# Patient Record
Sex: Male | Born: 1985 | Race: White | Hispanic: No | Marital: Single | Smoking: Never smoker
Health system: Southern US, Community
[De-identification: ages and names within clinical notes are randomized; demographics above are authoritative.]

## PROBLEM LIST (undated history)

## (undated) DIAGNOSIS — R55 Syncope and collapse: Secondary | ICD-10-CM

## (undated) DIAGNOSIS — R0789 Other chest pain: Secondary | ICD-10-CM

## (undated) DIAGNOSIS — R001 Bradycardia, unspecified: Secondary | ICD-10-CM

## (undated) DIAGNOSIS — R42 Dizziness and giddiness: Secondary | ICD-10-CM

## (undated) DIAGNOSIS — R519 Headache, unspecified: Secondary | ICD-10-CM

## (undated) HISTORY — PX: WISDOM TOOTH EXTRACTION: SHX21

## (undated) HISTORY — PX: FINGER SURGERY: SHX640

## (undated) HISTORY — DX: Headache, unspecified: R51.9

## (undated) HISTORY — DX: Syncope and collapse: R55

## (undated) HISTORY — DX: Dizziness and giddiness: R42

## (undated) HISTORY — PX: MANDIBLE FRACTURE SURGERY: SHX706

## (undated) HISTORY — PX: SHOULDER ARTHROSCOPY W/ LABRAL REPAIR: SHX2399

## (undated) HISTORY — DX: Bradycardia, unspecified: R00.1

## (undated) HISTORY — PX: KNEE SURGERY: SHX244

## (undated) HISTORY — DX: Other chest pain: R07.89

## (undated) HISTORY — PX: TONSILLECTOMY: SUR1361

---
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
## 2008-11-20 ENCOUNTER — Emergency Department (HOSPITAL_BASED_OUTPATIENT_CLINIC_OR_DEPARTMENT_OTHER): Admission: EM | Admit: 2008-11-20 | Discharge: 2008-11-20 | Payer: Self-pay | Admitting: Emergency Medicine

## 2008-11-20 ENCOUNTER — Ambulatory Visit: Payer: Self-pay | Admitting: Diagnostic Radiology

## 2010-04-08 LAB — CBC
Hemoglobin: 15.6 g/dL (ref 13.0–17.0)
RBC: 5.03 MIL/uL (ref 4.22–5.81)
RDW: 12 % (ref 11.5–15.5)

## 2010-04-08 LAB — POCT TOXICOLOGY PANEL

## 2010-04-08 LAB — DIFFERENTIAL
Basophils Absolute: 0 10*3/uL (ref 0.0–0.1)
Lymphocytes Relative: 10 % — ABNORMAL LOW (ref 12–46)
Monocytes Absolute: 0.7 10*3/uL (ref 0.1–1.0)
Monocytes Relative: 7 % (ref 3–12)
Neutro Abs: 7.4 10*3/uL (ref 1.7–7.7)

## 2010-04-08 LAB — URINE MICROSCOPIC-ADD ON

## 2010-04-08 LAB — URINALYSIS, ROUTINE W REFLEX MICROSCOPIC
Bilirubin Urine: NEGATIVE
Leukocytes, UA: NEGATIVE
Nitrite: NEGATIVE
Specific Gravity, Urine: 1.018 (ref 1.005–1.030)
pH: 5.5 (ref 5.0–8.0)

## 2010-04-08 LAB — BASIC METABOLIC PANEL
Calcium: 9.6 mg/dL (ref 8.4–10.5)
GFR calc Af Amer: >60 mL/min (ref >60)
GFR calc non Af Amer: >60 mL/min (ref >60)
Sodium: 144 mEq/L (ref 135–145)

## 2010-04-11 ENCOUNTER — Emergency Department (HOSPITAL_COMMUNITY)
Admission: EM | Admit: 2010-04-11 | Discharge: 2010-04-11 | Disposition: A | Discharge status: Home / Self Care | Payer: Federal, State, Local not specified - PPO | Attending: Emergency Medicine | Admitting: Emergency Medicine

## 2010-04-11 ENCOUNTER — Emergency Department (HOSPITAL_COMMUNITY): Payer: Federal, State, Local not specified - PPO

## 2010-04-11 DIAGNOSIS — S02609A Fracture of mandible, unspecified, initial encounter for closed fracture: Secondary | ICD-10-CM | POA: Insufficient documentation

## 2010-04-11 DIAGNOSIS — R51 Headache: Secondary | ICD-10-CM | POA: Insufficient documentation

## 2010-04-16 ENCOUNTER — Ambulatory Visit (HOSPITAL_COMMUNITY)
Admission: RE | Admit: 2010-04-16 | Payer: Federal, State, Local not specified - PPO | Source: Ambulatory Visit | Admitting: Plastic Surgery

## 2010-04-16 ENCOUNTER — Ambulatory Visit (HOSPITAL_BASED_OUTPATIENT_CLINIC_OR_DEPARTMENT_OTHER)
Admission: RE | Admit: 2010-04-16 | Discharge: 2010-04-16 | Disposition: A | Discharge status: Home / Self Care | Payer: Federal, State, Local not specified - PPO | Source: Ambulatory Visit | Attending: Plastic Surgery | Admitting: Plastic Surgery

## 2010-04-16 DIAGNOSIS — S02620A Fracture of subcondylar process of mandible, unspecified side, initial encounter for closed fracture: Secondary | ICD-10-CM | POA: Insufficient documentation

## 2010-04-16 DIAGNOSIS — Y998 Other external cause status: Secondary | ICD-10-CM | POA: Insufficient documentation

## 2010-04-16 DIAGNOSIS — Z01812 Encounter for preprocedural laboratory examination: Secondary | ICD-10-CM | POA: Insufficient documentation

## 2010-04-16 LAB — POCT HEMOGLOBIN-HEMACUE: Hemoglobin: 15 g/dL (ref 13.0–17.0)

## 2010-04-23 ENCOUNTER — Other Ambulatory Visit: Payer: Self-pay | Admitting: Plastic Surgery

## 2010-04-23 ENCOUNTER — Ambulatory Visit (HOSPITAL_COMMUNITY)
Admission: RE | Admit: 2010-04-23 | Discharge: 2010-04-23 | Disposition: A | Discharge status: Home / Self Care | Payer: Federal, State, Local not specified - PPO | Source: Ambulatory Visit | Attending: Plastic Surgery | Admitting: Plastic Surgery

## 2010-04-23 DIAGNOSIS — G8918 Other acute postprocedural pain: Secondary | ICD-10-CM

## 2010-04-23 DIAGNOSIS — M2669 Other specified disorders of temporomandibular joint: Secondary | ICD-10-CM | POA: Insufficient documentation
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
## 2010-05-01 ENCOUNTER — Ambulatory Visit (HOSPITAL_BASED_OUTPATIENT_CLINIC_OR_DEPARTMENT_OTHER)
Admission: RE | Admit: 2010-05-01 | Discharge: 2010-05-01 | Disposition: A | Discharge status: Home / Self Care | Payer: Federal, State, Local not specified - PPO | Source: Ambulatory Visit | Attending: Plastic Surgery | Admitting: Plastic Surgery

## 2010-05-01 DIAGNOSIS — S02609A Fracture of mandible, unspecified, initial encounter for closed fracture: Secondary | ICD-10-CM | POA: Insufficient documentation
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
## 2010-06-15 ENCOUNTER — Ambulatory Visit (HOSPITAL_BASED_OUTPATIENT_CLINIC_OR_DEPARTMENT_OTHER)
Admission: RE | Admit: 2010-06-15 | Discharge: 2010-06-15 | Disposition: A | Discharge status: Home / Self Care | Payer: Federal, State, Local not specified - PPO | Source: Ambulatory Visit | Attending: Plastic Surgery | Admitting: Plastic Surgery

## 2010-06-15 DIAGNOSIS — Z4789 Encounter for other orthopedic aftercare: Secondary | ICD-10-CM | POA: Insufficient documentation

## 2010-06-15 DIAGNOSIS — Z01812 Encounter for preprocedural laboratory examination: Secondary | ICD-10-CM | POA: Insufficient documentation
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
## 2019-02-18 ENCOUNTER — Other Ambulatory Visit: Payer: Self-pay

## 2019-02-18 ENCOUNTER — Emergency Department (HOSPITAL_BASED_OUTPATIENT_CLINIC_OR_DEPARTMENT_OTHER)
Admission: EM | Admit: 2019-02-18 | Discharge: 2019-02-18 | Disposition: A | Discharge status: Home / Self Care | Payer: BC Managed Care – PPO | Attending: Emergency Medicine | Admitting: Emergency Medicine

## 2019-02-18 ENCOUNTER — Encounter (HOSPITAL_BASED_OUTPATIENT_CLINIC_OR_DEPARTMENT_OTHER): Payer: Self-pay | Admitting: Emergency Medicine

## 2019-02-18 DIAGNOSIS — K625 Hemorrhage of anus and rectum: Secondary | ICD-10-CM | POA: Insufficient documentation

## 2019-02-18 LAB — CBC WITH DIFFERENTIAL/PLATELET
Abs Immature Granulocytes: 0.14 10*3/uL — ABNORMAL HIGH (ref 0.00–0.07)
Basophils Absolute: 0.1 10*3/uL (ref 0.0–0.1)
Basophils Relative: 1 %
Eosinophils Absolute: 0.3 10*3/uL (ref 0.0–0.5)
Eosinophils Relative: 3 %
HCT: 42.2 % (ref 39.0–52.0)
Hemoglobin: 14.6 g/dL (ref 13.0–17.0)
Immature Granulocytes: 1 %
Lymphocytes Relative: 33 %
Lymphs Abs: 3.3 10*3/uL (ref 0.7–4.0)
MCH: 30.2 pg (ref 26.0–34.0)
MCHC: 34.6 g/dL (ref 30.0–36.0)
MCV: 87.2 fL (ref 80.0–100.0)
Monocytes Absolute: 0.8 10*3/uL (ref 0.1–1.0)
Monocytes Relative: 7 %
Neutro Abs: 5.6 10*3/uL (ref 1.7–7.7)
Neutrophils Relative %: 55 %
Platelets: 299 10*3/uL (ref 150–400)
RBC: 4.84 MIL/uL (ref 4.22–5.81)
RDW: 12 % (ref 11.5–15.5)
WBC: 10.2 10*3/uL (ref 4.0–10.5)
nRBC: 0 % (ref 0.0–0.2)

## 2019-02-18 NOTE — Discharge Instructions (Signed)
Follow up with GI in the office. Return for worsening bleeding, abdominal pain, or if you feel like you may pass out

## 2019-02-18 NOTE — ED Provider Notes (Signed)
[facility] EMERGENCY DEPARTMENT Provider Note   CSN: [id number] Arrival date & time: 02/18/19  2127     History Chief Complaint  Patient presents with  . GI Bleeding    Roger Maddox is a 34 y.o. male.  34 yo M with a chief complaints of bright red blood per rectum.  He had one episode of this earlier today.  When he looked at the blood in the bowl he felt lightheaded for short period of time which is resolved.  Denies any abdominal pain denies diarrhea denies itching or pain at his rectum.  Denies nausea or vomiting.  Has had some issues off and on with wiping with toilet paper and seeing blood on the paper.  Has been being treated for a sinus infection and went out drinking last night which she thought could have caused the problem.  Is at the end of a steroid taper.  The history is provided by the patient.  Illness Severity:  Moderate Onset quality:  Sudden Duration:  1 day Timing:  Rare Progression:  Resolved Chronicity:  New Associated symptoms: no abdominal pain, no chest pain, no congestion, no diarrhea, no fever, no headaches, no myalgias, no rash, no shortness of breath and no vomiting        History reviewed. No pertinent past medical history.  There are no problems to display for this patient.   Past Surgical History:  Procedure Laterality Date  . FINGER SURGERY Right   . KNEE SURGERY Left   . MANDIBLE FRACTURE SURGERY    . SHOULDER ARTHROSCOPY W/ LABRAL REPAIR Right   . TONSILLECTOMY    . WISDOM TOOTH EXTRACTION         No family history on file.  Social History   Tobacco Use  . Smoking status: Never Smoker  . Smokeless tobacco: Current User  Substance Use Topics  . Alcohol use: Yes    Comment: occasionally  . Drug use: Never    Home Medications Prior to Admission medications   Not on File    Allergies    Codeine  Review of Systems   Review of Systems  Constitutional: Negative for chills and fever.  HENT: Negative for  congestion and facial swelling.   Eyes: Negative for discharge and visual disturbance.  Respiratory: Negative for shortness of breath.   Cardiovascular: Negative for chest pain and palpitations.  Gastrointestinal: Positive for blood in stool. Negative for abdominal pain, diarrhea and vomiting.  Musculoskeletal: Negative for arthralgias and myalgias.  Skin: Negative for color change and rash.  Neurological: Negative for tremors, syncope and headaches.  Psychiatric/Behavioral: Negative for confusion and dysphoric mood.    Physical Exam Updated Vital Signs BP (!) 149/89 (BP Location: Right Arm)   Pulse 70   Temp 98.1 F (36.7 C) (Oral)   Resp 18   Ht 6\' 4"  (1.93 m)   Wt 99.8 kg   SpO2 100%   BMI 26.78 kg/m   Physical Exam Vitals and nursing note reviewed.  Constitutional:      Appearance: He is well-developed.  HENT:     Head: Normocephalic and atraumatic.  Eyes:     Pupils: Pupils are equal, round, and reactive to light.  Neck:     Vascular: No JVD.  Cardiovascular:     Rate and Rhythm: Normal rate and regular rhythm.     Heart sounds: No murmur. No friction rub. No gallop.   Pulmonary:     Effort: No respiratory distress.  Breath sounds: No wheezing.  Abdominal:     General: There is no distension.     Tenderness: There is no abdominal tenderness. There is no guarding or rebound.  Genitourinary:    Comments: No hemorrhoids no masses no stool in the vault no blood Musculoskeletal:        General: Normal range of motion.     Cervical back: Normal range of motion and neck supple.  Skin:    Coloration: Skin is not pale.     Findings: No rash.  Neurological:     Mental Status: He is alert and oriented to person, place, and time.  Psychiatric:        Behavior: Behavior normal.     ED Results / Procedures / Treatments   Labs (all labs ordered are listed, but only abnormal results are displayed) Labs Reviewed  CBC WITH DIFFERENTIAL/PLATELET - Abnormal; Notable  for the following components:      Result Value   Abs Immature Granulocytes 0.14 (*)    All other components within normal limits    EKG None  Radiology No results found.  Procedures Procedures (including critical care time)  Medications Ordered in ED Medications - No data to display  ED Course  I have reviewed the triage vital signs and the nursing notes.  Pertinent labs & imaging results that were available during my care of the patient were reviewed by me and considered in my medical decision making (see chart for details).    MDM Rules/Calculators/A&P                      34 yo M with a chief complaints of bright red blood per rectum.  Occurred x1 today.  He is well-appearing and nontoxic.  No pallor.  Benign abdominal exam.  Rectal exam with no stool in the vault no blood noted on my finger no hemorrhoids.  Will obtain a CBC.  No anemia.    10:28 PM:  I have discussed the diagnosis/risks/treatment options with the patient and believe the pt to be eligible for discharge home to follow-up with GI. We also discussed returning to the ED immediately if new or worsening sx occur. We discussed the sx which are most concerning (e.g., worsening bleeding, fever, abdominal pain. ) that necessitate immediate return. Medications administered to the patient during their visit and any new prescriptions provided to the patient are listed below.  Medications given during this visit Medications - No data to display   The patient appears reasonably screen and/or stabilized for discharge and I doubt any other medical condition or other [facility] requiring further screening, evaluation, or treatment in the ED at this time prior to discharge.   Final Clinical Impression(s) / ED Diagnoses Final diagnoses:  Rectal bleeding    Rx / DC Orders ED Discharge Orders    None       Deno Etienne, DO 02/18/19 2228

## 2019-02-18 NOTE — ED Notes (Signed)
ED Provider at bedside. 

## 2019-02-18 NOTE — ED Triage Notes (Signed)
Reports having a bloody stool x 1 this evening.  Denies having any pain before or after.

## 2019-02-19 ENCOUNTER — Telehealth: Payer: Self-pay

## 2019-02-19 NOTE — Telephone Encounter (Signed)
Pt will call back to schedule consult. Per Lady Gary, pt needs hosp f/u for blood in stools. Schedule appt with APP.

## 2019-02-26 NOTE — Telephone Encounter (Signed)
Called and spoke to Mr Roger Maddox regarding appointment needed. He told me he went somewhere else and is now doing much better.

## 2019-05-16 ENCOUNTER — Other Ambulatory Visit: Payer: Self-pay

## 2019-05-16 ENCOUNTER — Emergency Department (HOSPITAL_BASED_OUTPATIENT_CLINIC_OR_DEPARTMENT_OTHER): Payer: BC Managed Care – PPO

## 2019-05-16 ENCOUNTER — Emergency Department (HOSPITAL_BASED_OUTPATIENT_CLINIC_OR_DEPARTMENT_OTHER)
Admission: EM | Admit: 2019-05-16 | Discharge: 2019-05-16 | Disposition: A | Discharge status: Home / Self Care | Payer: BC Managed Care – PPO | Attending: Emergency Medicine | Admitting: Emergency Medicine

## 2019-05-16 ENCOUNTER — Encounter (HOSPITAL_BASED_OUTPATIENT_CLINIC_OR_DEPARTMENT_OTHER): Payer: Self-pay

## 2019-05-16 DIAGNOSIS — R079 Chest pain, unspecified: Secondary | ICD-10-CM

## 2019-05-16 DIAGNOSIS — R0602 Shortness of breath: Secondary | ICD-10-CM | POA: Insufficient documentation

## 2019-05-16 DIAGNOSIS — R202 Paresthesia of skin: Secondary | ICD-10-CM | POA: Insufficient documentation

## 2019-05-16 DIAGNOSIS — R0789 Other chest pain: Secondary | ICD-10-CM | POA: Diagnosis not present

## 2019-05-16 DIAGNOSIS — R200 Anesthesia of skin: Secondary | ICD-10-CM | POA: Diagnosis not present

## 2019-05-16 DIAGNOSIS — Z885 Allergy status to narcotic agent status: Secondary | ICD-10-CM | POA: Insufficient documentation

## 2019-05-16 DIAGNOSIS — R42 Dizziness and giddiness: Secondary | ICD-10-CM | POA: Diagnosis present

## 2019-05-16 LAB — CBC
HCT: 46.9 % (ref 39.0–52.0)
Hemoglobin: 16.6 g/dL (ref 13.0–17.0)
MCH: 30.4 pg (ref 26.0–34.0)
MCHC: 35.4 g/dL (ref 30.0–36.0)
MCV: 85.9 fL (ref 80.0–100.0)
Platelets: 262 10*3/uL (ref 150–400)
RBC: 5.46 MIL/uL (ref 4.22–5.81)
RDW: 12.4 % (ref 11.5–15.5)
WBC: 8.5 10*3/uL (ref 4.0–10.5)
nRBC: 0 % (ref 0.0–0.2)

## 2019-05-16 LAB — BASIC METABOLIC PANEL
Anion gap: 9 (ref 5–15)
BUN: 19 mg/dL (ref 6–20)
CO2: 25 mmol/L (ref 22–32)
Calcium: 10.2 mg/dL (ref 8.9–10.3)
Chloride: 108 mmol/L (ref 98–111)
Creatinine, Ser: 1.3 mg/dL — ABNORMAL HIGH (ref 0.61–1.24)
GFR calc Af Amer: >60 mL/min (ref >60)
GFR calc non Af Amer: >60 mL/min (ref >60)
Glucose, Bld: 98 mg/dL (ref 70–99)
Potassium: 4 mmol/L (ref 3.5–5.1)
Sodium: 142 mmol/L (ref 135–145)

## 2019-05-16 LAB — TROPONIN I (HIGH SENSITIVITY): Troponin I (High Sensitivity): 2 ng/L (ref <18)

## 2019-05-16 LAB — D-DIMER, QUANTITATIVE: D-Dimer, Quant: 0.3 ug/mL-FEU (ref 0.00–0.50)

## 2019-05-16 MED ORDER — HYDROXYZINE HCL 25 MG PO TABS: 25.0000 mg | ORAL_TABLET | Freq: Three times a day (TID) | ORAL | 0 refills | Status: AC | PRN

## 2019-05-16 MED ORDER — HYDROXYZINE HCL 25 MG PO TABS
25.0000 mg | ORAL_TABLET | Freq: Once | ORAL | Status: AC
  Administered 2019-05-16: 25 mg via ORAL
  Filled 2019-05-16: qty 1

## 2019-05-16 NOTE — Discharge Instructions (Addendum)
Your work-up today was reassuring.  Please follow-up with your primary care provider regarding today's encounter.  Please take the medication, as needed for symptoms of anxiety.  It can make you drowsy, so please do not drive after taking the medication.  You received here today in the ED, so use your best judgment.  Return to the ED or seek immediate medical attention if you experience any new or worsening symptoms.

## 2019-05-16 NOTE — ED Provider Notes (Addendum)
[facility] EMERGENCY DEPARTMENT Provider Note   CSN: [id number] Arrival date & time: 05/16/19  1428     History Chief Complaint  Patient presents with  . Dizziness    Roger Maddox is a 34 y.o. male with PMH of anxiety who presents to the ED with a 1 day history of lightheadedness, constant chest pain, shortness of breath, and left upper arm "numbness and tingling".  Patient states that he has been feeling weak for a few months and was referred to a clinic for testosterone supplementation.  He received an injection 3 days ago and he had tolerated the procedure without difficulty.  However, he was out to dinner with his wife when he developed acute onset chest discomfort and difficulty breathing.  He then felt as though his symptoms radiated towards his left arm which he described as "numb".  He went to an urgent care immediately after his onset of symptoms and EKG and chest x-ray was obtained and unremarkable.  Patient was informed that he was likely related to his history of anxiety, however his symptoms did not improve he would need to come to the ED for evaluation.  Patient denies any pleuritic symptoms, but states that it is difficult "catching his breath".  He endorses a family history of premature cardiac death, his grandfather had an MI in his 46s.  He denies any recent illness, fevers or chills, abdominal pain, nausea or vomiting, back pain, urinary symptoms, or changes in bowel habits.  No obvious aggravating or alleviating factors.  He does not take any medications for his anxiety.  HPI     History reviewed. No pertinent past medical history.  There are no problems to display for this patient.   Past Surgical History:  Procedure Laterality Date  . FINGER SURGERY Right   . KNEE SURGERY Left   . MANDIBLE FRACTURE SURGERY    . SHOULDER ARTHROSCOPY W/ LABRAL REPAIR Right   . TONSILLECTOMY    . WISDOM TOOTH EXTRACTION         No family history on  file.  Social History   Tobacco Use  . Smoking status: Never Smoker  . Smokeless tobacco: Current User  Substance Use Topics  . Alcohol use: Yes    Comment: occasionally  . Drug use: Never    Home Medications Prior to Admission medications   Medication Sig Start Date End Date Taking? Authorizing Provider  hydrOXYzine (ATARAX/VISTARIL) 25 MG tablet Take 1 tablet (25 mg total) by mouth every 8 (eight) hours as needed for anxiety. 05/16/19   Lorelee New, PA-C    Allergies    Codeine  Review of Systems   Review of Systems  All other systems reviewed and are negative.   Physical Exam Updated Vital Signs BP 117/80 (BP Location: Left Arm)   Pulse (!) 106   Temp 98.2 F (36.8 C) (Oral)   Resp 18   Ht 6' (1.829 m)   Wt 94.8 kg   SpO2 98%   BMI 28.35 kg/m   Physical Exam Vitals and nursing note reviewed. Exam conducted with a chaperone present.  Constitutional:      Appearance: Normal appearance.  HENT:     Head: Normocephalic and atraumatic.  Eyes:     General: No scleral icterus.    Conjunctiva/sclera: Conjunctivae normal.  Cardiovascular:     Rate and Rhythm: Normal rate and regular rhythm.     Pulses: Normal pulses.     Heart sounds: Normal heart sounds.  Pulmonary:     Effort: Pulmonary effort is normal.     Breath sounds: Normal breath sounds.  Abdominal:     General: Abdomen is flat. There is no distension.     Palpations: Abdomen is soft.     Tenderness: There is no abdominal tenderness.  Musculoskeletal:     Cervical back: Normal range of motion. No rigidity.  Skin:    General: Skin is dry.     Capillary Refill: Capillary refill takes less than 2 seconds.  Neurological:     Mental Status: He is alert and oriented to person, place, and time.     GCS: GCS eye subscore is 4. GCS verbal subscore is 5. GCS motor subscore is 6.  Psychiatric:        Mood and Affect: Mood normal.        Behavior: Behavior normal.        Thought Content: Thought  content normal.     ED Results / Procedures / Treatments   Labs (all labs ordered are listed, but only abnormal results are displayed) Labs Reviewed  BASIC METABOLIC PANEL - Abnormal; Notable for the following components:      Result Value   Creatinine, Ser 1.30 (*)    All other components within normal limits  CBC  D-DIMER, QUANTITATIVE (NOT AT [facility])  TROPONIN I (HIGH SENSITIVITY)    EKG EKG Interpretation  Date/Time:  Wednesday May 16 2019 14:42:07 EDT Ventricular Rate:  90 PR Interval:    QRS Duration: 95 QT Interval:  356 QTC Calculation: 436 R Axis:   12 Text Interpretation: Sinus rhythm No STEMI Confirmed by Alvester Chou [phone]) on 05/16/2019 3:49:18 PM   Radiology DG Chest 2 View  Result Date: 05/16/2019 CLINICAL DATA:  Chest pain, shortness of breath, recent testosterone injection EXAM: CHEST - 2 VIEW COMPARISON:  Radiograph 11/24/2015 FINDINGS: No consolidation, features of edema, pneumothorax, or effusion. Pulmonary vascularity is normally distributed. The cardiomediastinal contours are unremarkable. No acute osseous or soft tissue abnormality. IMPRESSION: No acute cardiopulmonary abnormality. Electronically Signed   By: Kreg Shropshire M.D.   On: 05/16/2019 15:37    Procedures Procedures (including critical care time)  Medications Ordered in ED Medications  hydrOXYzine (ATARAX/VISTARIL) tablet 25 mg (25 mg Oral Given 05/16/19 1524)    ED Course  I have reviewed the triage vital signs and the nursing notes.  Pertinent labs & imaging results that were available during my care of the patient were reviewed by me and considered in my medical decision making (see chart for details).    MDM Rules/Calculators/A&P                      Given that the patient is mildly tachycardic here in the ED in conjunction with his recent hormone therapy and sudden onset chest pain and shortness of breath symptoms, will obtain D-dimer in addition to basic chest pain work-up.  If  elevated, will proceed with CTA of the chest.  Discussed this with patient and he agrees with plan.  His primary concern was pulmonary embolism.  Will provide patient with 25 mg hydroxyzine while awaiting his laboratory work-up and imaging.    I reviewed patient's EKG which demonstrates normal sinus rhythm and no evidence of ischemia.  I personally examined plain films obtained of chest which demonstrates no acute cardiopulmonary findings.  His laboratory work-up was entirely unremarkable.  No anemia.  D-dimer well within normal limits.  His only BMP derangement is a  mildly elevated creatinine, but GFR preserved.  Will obtain one-time troponin given that his onset of constant chest discomfort began approximately 18+ hours ago.  Patient is denying any room spinning dizziness and there is no nystagmus.  Neurologically intact.  On reevaluation patient reports that his mildly elevated creatinine and tachycardia is likely attributed to the fact that he has not been drinking much water today.  No actual numbness on physical exam.  He denies any significant "tearing" chest pain and I have low suspicion for dissection or other emergent pathology.  Do not feel as though further imaging is warranted.  We will prescribe patient hydroxyzine to go home with.  Patient reports mild improvement of symptoms after receiving a dose here in the ED.  Encouraged him to follow-up with his primary care provider regarding today's encounter.  Strict ED return precautions discussed.  All of the evaluation and work-up results were discussed with the patient and any family at bedside. They were provided opportunity to ask any additional questions and have none at this time. They have expressed understanding of verbal discharge instructions as well as return precautions and are agreeable to the plan.    Final Clinical Impression(s) / ED Diagnoses Final diagnoses:  Nonspecific chest pain    Rx / DC Orders ED Discharge Orders          Ordered    hydrOXYzine (ATARAX/VISTARIL) 25 MG tablet  Every 8 hours PRN     05/16/19 1639           Corena Herter, PA-C 05/16/19 1640    Corena Herter, PA-C 05/16/19 1641    Wyvonnia Dusky, MD 05/17/19 1035

## 2019-05-16 NOTE — ED Triage Notes (Signed)
Pt c/o dizziness, numbness to left UE, SOB, CP started last night-states he was seen at [facility] last night-states he did receive first testosterone injection 2 days ago-NAD-steady gait

## 2019-06-23 ENCOUNTER — Other Ambulatory Visit: Payer: Self-pay

## 2019-06-23 ENCOUNTER — Ambulatory Visit (HOSPITAL_COMMUNITY)
Admission: EM | Admit: 2019-06-23 | Discharge: 2019-06-23 | Disposition: A | Discharge status: Home / Self Care | Payer: BC Managed Care – PPO | Attending: Family Medicine | Admitting: Family Medicine

## 2019-06-23 ENCOUNTER — Encounter (HOSPITAL_COMMUNITY): Payer: Self-pay

## 2019-06-23 DIAGNOSIS — J069 Acute upper respiratory infection, unspecified: Secondary | ICD-10-CM | POA: Diagnosis not present

## 2019-06-23 DIAGNOSIS — R059 Cough, unspecified: Secondary | ICD-10-CM

## 2019-06-23 DIAGNOSIS — R0982 Postnasal drip: Secondary | ICD-10-CM

## 2019-06-23 MED ORDER — CEFDINIR 300 MG PO CAPS: 300.0000 mg | ORAL_CAPSULE | Freq: Two times a day (BID) | ORAL | 0 refills | Status: DC

## 2019-06-23 NOTE — ED Triage Notes (Signed)
Pt present sinus problems, symptoms started 4 days ago. Pt has tired otc medication with no relief. Pt also states his mucous is dark green

## 2019-06-25 NOTE — ED Provider Notes (Signed)
[facility]ENTER   [id number] 06/23/19 Arrival Time: 1622  ASSESSMENT & PLAN:  1. URI, acute   2. Cough   3. Post-nasal drainage     See AVS for discharge instructions. Will be traveling on vacation later in the week. Declines COVID testing.  If not improving over the next 2-3 days will begin: Meds ordered this encounter  Medications  . cefdinir (OMNICEF) 300 MG capsule    Sig: Take 1 capsule (300 mg total) by mouth 2 (two) times daily.    Dispense:  20 capsule    Refill:  0    OTC symptom care as needed.   Follow-up Information    [facility].   Specialty: Urgent Care Why: If worsening or failing to improve as anticipated. Contact information: [address] [place] [address] [phone]              Reviewed expectations re: course of current medical issues. Questions answered. Outlined signs and symptoms indicating need for more acute intervention. Patient verbalized understanding. After Visit Summary given.   SUBJECTIVE: History from: patient.  Roger Maddox is a 34 y.o. male who presents with complaint of nasal congestion, post-nasal drainage, and sinus pain. Onset gradual, several days ago; possible one week ago. Respiratory symptoms: mild dry cough. Fever: absent. Overall normal PO intake without n/v. OTC treatment: decong without relief. Seasonal allergies: no. History of frequent sinus infections: occasional. No specific aggravating or alleviating factors reported.   Social History   Tobacco Use  Smoking Status Never Smoker  Smokeless Tobacco Current User    OBJECTIVE:  Vitals:   06/23/19 1650  BP: 116/78  Pulse: 89  Resp: 18  Temp: 98.4 F (36.9 C)  TempSrc: Oral  SpO2: 98%     General appearance: alert; no distress HEENT: nasal congestion; clear runny nose; throat irritation secondary to post-nasal drainage; mild bilateral maxillary tenderness to palpation Neck: supple without  LAD; trachea midline Lungs: unlabored respirations, symmetrical air entry; cough: absent; no respiratory distress Skin: warm and dry Psychological: alert and cooperative; normal mood and affect  Allergies  Allergen Reactions  . Codeine Anaphylaxis    History reviewed. No pertinent past medical history. History reviewed. No pertinent family history. Social History   Socioeconomic History  . Marital status: Single    Spouse name: Not on file  . Number of children: Not on file  . Years of education: Not on file  . Highest education level: Not on file  Occupational History  . Not on file  Tobacco Use  . Smoking status: Never Smoker  . Smokeless tobacco: Current User  Vaping Use  . Vaping Use: Never used  Substance and Sexual Activity  . Alcohol use: Yes    Comment: occasionally  . Drug use: Never  . Sexual activity: Not on file  Other Topics Concern  . Not on file  Social History Narrative  . Not on file   Social Determinants of Health   Financial Resource Strain:   . Difficulty of Paying Living Expenses:   Food Insecurity:   . Worried About Programme researcher, broadcasting/film/video in the Last Year:   . Barista in the Last Year:   Transportation Needs:   . Freight forwarder (Medical):   Marland Kitchen Lack of Transportation (Non-Medical):   Physical Activity:   . Days of Exercise per Week:   . Minutes of Exercise per Session:   Stress:   . Feeling  of Stress :   Social Connections:   . Frequency of Communication with Friends and Family:   . Frequency of Social Gatherings with Friends and Family:   . Attends Religious Services:   . Active Member of Clubs or Organizations:   . Attends Archivist Meetings:   Marland Kitchen Marital Status:   Intimate Partner Violence:   . Fear of Current or Ex-Partner:   . Emotionally Abused:   Marland Kitchen Physically Abused:   . Sexually Abused:              Vanessa Kick, MD 06/25/19 1037

## 2019-09-02 ENCOUNTER — Other Ambulatory Visit: Payer: Self-pay

## 2019-09-02 ENCOUNTER — Encounter (HOSPITAL_BASED_OUTPATIENT_CLINIC_OR_DEPARTMENT_OTHER): Payer: Self-pay | Admitting: *Deleted

## 2019-09-02 ENCOUNTER — Emergency Department (HOSPITAL_BASED_OUTPATIENT_CLINIC_OR_DEPARTMENT_OTHER): Payer: BLUE CROSS/BLUE SHIELD

## 2019-09-02 ENCOUNTER — Emergency Department (HOSPITAL_BASED_OUTPATIENT_CLINIC_OR_DEPARTMENT_OTHER)
Admission: EM | Admit: 2019-09-02 | Discharge: 2019-09-02 | Disposition: A | Discharge status: Home / Self Care | Payer: BLUE CROSS/BLUE SHIELD | Attending: Emergency Medicine | Admitting: Emergency Medicine

## 2019-09-02 DIAGNOSIS — R0602 Shortness of breath: Secondary | ICD-10-CM | POA: Insufficient documentation

## 2019-09-02 DIAGNOSIS — R42 Dizziness and giddiness: Secondary | ICD-10-CM | POA: Insufficient documentation

## 2019-09-02 DIAGNOSIS — R001 Bradycardia, unspecified: Secondary | ICD-10-CM | POA: Insufficient documentation

## 2019-09-02 DIAGNOSIS — R0789 Other chest pain: Secondary | ICD-10-CM | POA: Diagnosis not present

## 2019-09-02 LAB — BASIC METABOLIC PANEL
Anion gap: 8 (ref 5–15)
BUN: 20 mg/dL (ref 6–20)
CO2: 27 mmol/L (ref 22–32)
Calcium: 9.6 mg/dL (ref 8.9–10.3)
Chloride: 104 mmol/L (ref 98–111)
Creatinine, Ser: 1.11 mg/dL (ref 0.61–1.24)
GFR calc Af Amer: >60 mL/min (ref >60)
GFR calc non Af Amer: >60 mL/min (ref >60)
Glucose, Bld: 104 mg/dL — ABNORMAL HIGH (ref 70–99)
Potassium: 4.4 mmol/L (ref 3.5–5.1)
Sodium: 139 mmol/L (ref 135–145)

## 2019-09-02 LAB — CBC
HCT: 47.4 % (ref 39.0–52.0)
Hemoglobin: 16.1 g/dL (ref 13.0–17.0)
MCH: 29.8 pg (ref 26.0–34.0)
MCHC: 34 g/dL (ref 30.0–36.0)
MCV: 87.8 fL (ref 80.0–100.0)
Platelets: 255 10*3/uL (ref 150–400)
RBC: 5.4 MIL/uL (ref 4.22–5.81)
RDW: 12.2 % (ref 11.5–15.5)
WBC: 6.5 10*3/uL (ref 4.0–10.5)
nRBC: 0 % (ref 0.0–0.2)

## 2019-09-02 LAB — TROPONIN I (HIGH SENSITIVITY): Troponin I (High Sensitivity): <2 ng/L (ref <18)

## 2019-09-02 NOTE — ED Notes (Addendum)
ED Provider at bedside. 

## 2019-09-02 NOTE — Discharge Instructions (Signed)
Please read and follow all provided instructions.  Your diagnoses today include:  1. Lightheadedness   2. Bradycardia     Tests performed today include:  An EKG of your heart  A chest x-ray  Cardiac enzymes - a blood test for heart muscle damage  Blood counts and electrolytes  Vital signs. See below for your results today.   Medications prescribed:   None  Take any prescribed medications only as directed.  Follow-up instructions: Please follow-up with your primary care provider as soon as you can for further evaluation of your symptoms. You may also follow-up with the cardiologist listed.   Return instructions:  SEEK IMMEDIATE MEDICAL ATTENTION IF:  You have severe chest pain, especially if the pain is crushing or pressure-like and spreads to the arms, back, neck, or jaw, or if you have sweating, nausea (feeling sick to your stomach), or shortness of breath. THIS IS AN EMERGENCY. Don't wait to see if the pain will go away. Get medical help at once. Call 911 or 0 (operator). DO NOT drive yourself to the hospital.   Your chest pain gets worse and does not go away with rest.   You have an attack of chest pain lasting longer than usual, despite rest and treatment with the medications your caregiver has prescribed.   You wake from sleep with chest pain or shortness of breath.  You feel dizzy or faint.  You have chest pain not typical of your usual pain for which you originally saw your caregiver.   You have any other emergent concerns regarding your health.  Additional Information: Chest pain comes from many different causes. Your caregiver has diagnosed you as having chest pain that is not specific for one problem, but does not require admission.  You are at low risk for an acute heart condition or other serious illness.   Your vital signs today were: BP 109/78   Pulse (!) 57   Temp 98.2 F (36.8 C) (Oral)   Resp (!) 22   Ht 6\' 4"  (1.93 m)   Wt 99.8 kg   SpO2 98%    BMI 26.78 kg/m  If your blood pressure (BP) was elevated above 135/85 this visit, please have this repeated by your doctor within one month. --------------

## 2019-09-02 NOTE — ED Triage Notes (Signed)
Pt reports dizziness, chest tightness x 5 days. Pt went to UC and was told his HR was 47 and to come to the ED. Pt denies N/V. Pt has had 3 negative covid tests in the last week. Denies covid symptoms. Pt does report some SOB.

## 2019-09-02 NOTE — ED Provider Notes (Signed)
[facility] EMERGENCY DEPARTMENT Provider Note   CSN: [id number] Arrival date & time: 09/02/19  1238     History Chief Complaint  Patient presents with  . Bradycardia    Roger Maddox is a 34 y.o. male.  Patient presents to the emergency department with complaint of approximately 1 week of dizziness, lightheadedness, feels like he is going to pass out.  He has associated chest tightness in the left chest has been constant for the past 5 days.  He had a mild headache at onset that resolved.  No ear pain, runny nose, sore throat, cough.  He took 3 over-the-counter coronavirus tests all of which were negative.  He feels short of breath at times.  No lower extremity swelling.  Patient went to an outside urgent care and was referred to the ED because his heart rate was 47.  No full syncope.  No history of heart disease in first-degree relatives, however he has a grandfather who had multiple heart attacks starting in the 50s.  It has been a while since the patient has had routine physical labs.  No history of thyroid problems.  No recent weight gain.  Patient denies signs of stroke including: facial droop, slurred speech, aphasia, weakness/numbness in extremities, imbalance/trouble walking.        History reviewed. No pertinent past medical history.  There are no problems to display for this patient.   Past Surgical History:  Procedure Laterality Date  . FINGER SURGERY Right   . KNEE SURGERY Left   . MANDIBLE FRACTURE SURGERY    . SHOULDER ARTHROSCOPY W/ LABRAL REPAIR Right   . TONSILLECTOMY    . WISDOM TOOTH EXTRACTION         History reviewed. No pertinent family history.  Social History   Tobacco Use  . Smoking status: Never Smoker  . Smokeless tobacco: Current User  Vaping Use  . Vaping Use: Never used  Substance Use Topics  . Alcohol use: Yes    Comment: occasionally  . Drug use: Never    Home Medications Prior to Admission medications     Medication Sig Start Date End Date Taking? Authorizing Provider  cefdinir (OMNICEF) 300 MG capsule Take 1 capsule (300 mg total) by mouth 2 (two) times daily. 06/23/19   Mardella Layman, MD  hydrOXYzine (ATARAX/VISTARIL) 25 MG tablet Take 1 tablet (25 mg total) by mouth every 8 (eight) hours as needed for anxiety. 05/16/19   Lorelee New, PA-C    Allergies    Codeine  Review of Systems   Review of Systems  Constitutional: Negative for diaphoresis and fever.  Eyes: Negative for redness.  Respiratory: Positive for chest tightness and shortness of breath. Negative for cough.   Cardiovascular: Negative for palpitations and leg swelling.  Gastrointestinal: Negative for abdominal pain, nausea and vomiting.  Genitourinary: Negative for dysuria.  Musculoskeletal: Negative for back pain and neck pain.  Skin: Negative for rash.  Neurological: Positive for light-headedness. Negative for syncope.  Psychiatric/Behavioral: The patient is not nervous/anxious.     Physical Exam Updated Vital Signs BP 123/90   Pulse (!) 55   Temp 98.2 F (36.8 C) (Oral)   Resp 19   Ht 6\' 4"  (1.93 m)   Wt 99.8 kg   SpO2 98%   BMI 26.78 kg/m   Physical Exam Vitals and nursing note reviewed.  Constitutional:      Appearance: He is well-developed.  HENT:     Head: Normocephalic and atraumatic.  Eyes:  General:        Right eye: No discharge.        Left eye: No discharge.     Conjunctiva/sclera: Conjunctivae normal.  Neck:     Thyroid: No thyromegaly.  Cardiovascular:     Rate and Rhythm: Normal rate and regular rhythm.     Heart sounds: Normal heart sounds.  Pulmonary:     Effort: Pulmonary effort is normal.     Breath sounds: Normal breath sounds.  Abdominal:     Palpations: Abdomen is soft.     Tenderness: There is no abdominal tenderness.  Musculoskeletal:     Cervical back: Normal range of motion and neck supple.  Skin:    General: Skin is warm and dry.  Neurological:     Mental  Status: He is alert.  Psychiatric:        Mood and Affect: Mood is anxious.     ED Results / Procedures / Treatments   Labs (all labs ordered are listed, but only abnormal results are displayed) Labs Reviewed  BASIC METABOLIC PANEL - Abnormal; Notable for the following components:      Result Value   Glucose, Bld 104 (*)    All other components within normal limits  CBC  TROPONIN I (HIGH SENSITIVITY)    EKG EKG Interpretation  Date/Time:  Sunday September 02 2019 12:47:06 EDT Ventricular Rate:  61 PR Interval:  130 QRS Duration: 98 QT Interval:  408 QTC Calculation: 410 R Axis:   35 Text Interpretation: Normal sinus rhythm Normal ECG No significant change since last tracing Confirmed by Alvira Monday ([id number]) on 09/02/2019 2:12:39 PM   Radiology DG Chest 2 View  Result Date: 09/02/2019 CLINICAL DATA:  Dizziness EXAM: CHEST - 2 VIEW COMPARISON:  05/16/2019 FINDINGS: The heart size and mediastinal contours are within normal limits. Both lungs are clear. No pleural effusion. The visualized skeletal structures are unremarkable. IMPRESSION: No acute process in the chest. Electronically Signed   By: Guadlupe Spanish M.D.   On: 09/02/2019 13:48    Procedures Procedures (including critical care time)  Medications Ordered in ED Medications - No data to display  ED Course  I have reviewed the triage vital signs and the nursing notes.  Pertinent labs & imaging results that were available during my care of the patient were reviewed by me and considered in my medical decision making (see chart for details).  Patient seen and examined. EKG reviewed.  We will check orthostatics and pulse oximetry with exertion.  Vital signs reviewed and are as follows: BP 109/78   Pulse (!) 57   Temp 98.2 F (36.8 C) (Oral)   Resp (!) 22   Ht 6\' 4"  (1.93 m)   Wt 99.8 kg   SpO2 98%   BMI 26.78 kg/m   After exam, I see that patient had a visit for similar symptoms earlier this year.  Work-up  today similar.  Feel patient will benefit from PCP evaluation and routine labs such as thyroid.  Will give cardiology referral as well for consideration echocardiography.   Orthostatic VS for the past 24 hrs:  BP- Lying Pulse- Lying BP- Sitting Pulse- Sitting BP- Standing at 0 minutes Pulse- Standing at 0 minutes  09/02/19 1528 109/80 53 125/90 88 110/79 77   4:26 PM patient ambulated without desaturation or difficulty.  Plan for discharged home.  Encouraged PCP follow-up and cardiology follow-up as discussed above.  Patient urged to return with worsening symptoms or other concerns. Patient  verbalized understanding and agrees with plan.        MDM Rules/Calculators/A&P                          Patient with vague lightheadedness and chest discomfort, sent for bradycardia.  Patient is in normal sinus rhythm here, with borderline bradycardia.  Troponin is normal.  Chest x-ray is clear.  Normal labs.  He would benefit from PCP and possibly cardiology follow-up.  He looks very well but anxious.  Stable for discharge home.    Final Clinical Impression(s) / ED Diagnoses Final diagnoses:  Lightheadedness  Bradycardia    Rx / DC Orders ED Discharge Orders    None       Renne Crigler, PA-C 09/02/19 1627    Jacalyn Lefevre, MD 09/02/19 2321

## 2019-09-05 ENCOUNTER — Encounter (HOSPITAL_COMMUNITY): Payer: Self-pay | Admitting: Emergency Medicine

## 2019-09-05 ENCOUNTER — Other Ambulatory Visit: Payer: Self-pay

## 2019-09-05 ENCOUNTER — Emergency Department (HOSPITAL_COMMUNITY)
Admission: EM | Admit: 2019-09-05 | Discharge: 2019-09-06 | Disposition: A | Discharge status: Home / Self Care | Payer: BLUE CROSS/BLUE SHIELD | Attending: Emergency Medicine | Admitting: Emergency Medicine

## 2019-09-05 ENCOUNTER — Emergency Department (HOSPITAL_COMMUNITY): Payer: BLUE CROSS/BLUE SHIELD

## 2019-09-05 DIAGNOSIS — Z5321 Procedure and treatment not carried out due to patient leaving prior to being seen by health care provider: Secondary | ICD-10-CM | POA: Diagnosis not present

## 2019-09-05 DIAGNOSIS — R42 Dizziness and giddiness: Secondary | ICD-10-CM | POA: Insufficient documentation

## 2019-09-05 DIAGNOSIS — R001 Bradycardia, unspecified: Secondary | ICD-10-CM | POA: Insufficient documentation

## 2019-09-05 DIAGNOSIS — R079 Chest pain, unspecified: Secondary | ICD-10-CM | POA: Diagnosis not present

## 2019-09-05 DIAGNOSIS — R0602 Shortness of breath: Secondary | ICD-10-CM | POA: Insufficient documentation

## 2019-09-05 LAB — CBC
HCT: 44.2 % (ref 39.0–52.0)
Hemoglobin: 14.8 g/dL (ref 13.0–17.0)
MCH: 29.6 pg (ref 26.0–34.0)
MCHC: 33.5 g/dL (ref 30.0–36.0)
MCV: 88.4 fL (ref 80.0–100.0)
Platelets: 262 10*3/uL (ref 150–400)
RBC: 5 MIL/uL (ref 4.22–5.81)
RDW: 12 % (ref 11.5–15.5)
WBC: 7 10*3/uL (ref 4.0–10.5)
nRBC: 0 % (ref 0.0–0.2)

## 2019-09-05 LAB — BASIC METABOLIC PANEL
Anion gap: 12 (ref 5–15)
BUN: 16 mg/dL (ref 6–20)
CO2: 24 mmol/L (ref 22–32)
Calcium: 9.4 mg/dL (ref 8.9–10.3)
Chloride: 103 mmol/L (ref 98–111)
Creatinine, Ser: 1.27 mg/dL — ABNORMAL HIGH (ref 0.61–1.24)
GFR calc Af Amer: >60 mL/min (ref >60)
GFR calc non Af Amer: >60 mL/min (ref >60)
Glucose, Bld: 102 mg/dL — ABNORMAL HIGH (ref 70–99)
Potassium: 3.7 mmol/L (ref 3.5–5.1)
Sodium: 139 mmol/L (ref 135–145)

## 2019-09-05 LAB — URINALYSIS, ROUTINE W REFLEX MICROSCOPIC
Bilirubin Urine: NEGATIVE
Glucose, UA: NEGATIVE mg/dL
Hgb urine dipstick: NEGATIVE
Ketones, ur: NEGATIVE mg/dL
Leukocytes,Ua: NEGATIVE
Nitrite: NEGATIVE
Protein, ur: NEGATIVE mg/dL
Specific Gravity, Urine: 1.028 (ref 1.005–1.030)
pH: 6 (ref 5.0–8.0)

## 2019-09-05 LAB — TROPONIN I (HIGH SENSITIVITY)
Troponin I (High Sensitivity): <2 ng/L (ref <18)
Troponin I (High Sensitivity): 3 ng/L (ref <18)

## 2019-09-05 LAB — TSH: TSH: 1.757 u[IU]/mL (ref 0.350–4.500)

## 2019-09-05 NOTE — ED Triage Notes (Addendum)
Patient arrives to ED with complaints of constant lightheadedness and dizziness for over a week now (8 days). Per patient over the past week he has had intermittent chest pain and shortness of breath. Patient states over the past week his HR has been in the upper 40's to low 50's. Pt states that PCP wants him to get thyroid checked and CT scan. Patient states that today he got so dizzy that he had a syncopal episode and fell while walking. No injury to head or neck.

## 2019-09-06 NOTE — Progress Notes (Signed)
Cardiology Office Note   Date:  09/07/2019   ID:  Roger Maddox, DOB Nov 13, 1985, MRN [id number]  PCP:  System, Provider Not In  Cardiologist:   Dietrich Pates, MD    Patient is referred for dizziness and bradycardia     History of Present Illness: Roger Maddox is a 34 y.o. male with no prior cardiac history.  He is very athletic, very active  10 days ago he says at about 3 PM he noticed the distinct onset of dizziness/ lightheadedness.  Not like vertigo   More lightheaded   He laid down   Did not go away   Following day (Wed) still very dizzy   Thursday  Also bad.  No syncope    He did say it is associated with chest tightness   Chest has been constantly tight, some SOB.   No ear pain   The pt took 3 COVID tests that were negative     On 09/02/19 he went to urgent care and then sent to  ED  Because HR 47  IN ED his pulse went up with standing consistent with POTS    BP stable      Rererred back to PCP and cardiology   The pt went to PCP on 8/31   Told MD that he had started Tumeric, VitC, Vit D and Zn   He stopped and said he was feeling some better   Did note that HR was slower than usual   At clinic visit it was back in 60s   Told to stop supplements.   Recomm fluids and sea salt     On Wed he played baseball (3rd base) for a few innings  Noted chst pressure (constant) and dizziness.  Passed out for about 30 sec   Admits to not drinking much fluids   He went back to ED     The pt complained of about 1-1.5 wks dizziness.   Also complained of chest tightness that was constant   Mild HA   SOme SOB   Took 3 over the counter covid tests which were negative    First went to urgent care.   Sent to ED becase HR 47   Went back to EK on 09/02/19 fro dizziness  SOB at times    BP in ED 109/78   P 57    Note FHx signif for grandfather who  had multiple MIs starting in 20s      Current Meds  Medication Sig  . hydrOXYzine (ATARAX/VISTARIL) 25 MG tablet Take 1 tablet (25 mg total) by mouth  every 8 (eight) hours as needed for anxiety.  . ondansetron (ZOFRAN-ODT) 4 MG disintegrating tablet Take 4 mg by mouth every 8 (eight) hours as needed.  . promethazine (PHENERGAN) 25 MG tablet Take 25 mg by mouth every 6 (six) hours as needed.     Allergies:   Codeine   Past Medical History:  Diagnosis Date  . Bradycardia   . Chest discomfort   . Dizziness   . Headache   . Lightheadedness   . Near syncope     Past Surgical History:  Procedure Laterality Date  . FINGER SURGERY Right   . KNEE SURGERY Left   . MANDIBLE FRACTURE SURGERY    . SHOULDER ARTHROSCOPY W/ LABRAL REPAIR Right   . TONSILLECTOMY    . WISDOM TOOTH EXTRACTION       Social History:  The patient  reports that he has never  smoked. He uses smokeless tobacco. He reports current alcohol use. He reports that he does not use drugs.   Family History:  The patient's family history is not on file.    ROS:  Please see the history of present illness. All other systems are reviewed and  Negative to the above problem except as noted.    PHYSICAL EXAM: VS:  BP 96/70   Pulse (!) 59   Ht 6\' 4"  (1.93 m)   Wt 209 lb 12.8 oz (95.2 kg)   SpO2 98%   BMI 25.54 kg/m   Orthostatics:     Laying :  112/80 P 48   Sitting  104/78  P 56   Standing 106/78  P 64  Standing 4 mn 110/90   P60   GEN: Well nourished, well developed, in no acute distress  HEENT: normal  Neck: no JVD, carotid bruits Cardiac: RRR; no murmurs, rubs, or gallops,no LE edema  Respiratory:  clear to auscultation bilaterally, No wheezes GI: soft, nontender, nondistended, + BS  No hepatomegaly  MS: no deformity Moving all extremities   Skin: warm and dry, no rash Neuro:  Strength and sensation are intact Psych: euthymic mood, full affect   EKG:  EKG is not ordered today.  On 9/1   SR 68 bpm    Lipid Panel No results found for: CHOL, TRIG, HDL, CHOLHDL, VLDL, LDLCALC, LDLDIRECT    Wt Readings from Last 3 Encounters:  09/07/19 209 lb 12.8 oz (95.2  kg)  09/02/19 220 lb (99.8 kg)  05/16/19 209 lb (94.8 kg)      ASSESSMENT AND PLAN:  1  Dizziness.  Pt had ABRUPT onset of dizziness 10 days ago at 3 PM   Has been dizzy since   Had one episode of syncope     On exam, pulse is slower at times    He does not meet criteria for orthostatic hypotension.    Walking with him in clinc he was dizzy but his HR only went into 70s    With abrupt onset and constant sensation concern for neuro source   Will set u for MRI/MRA of brain Will also set up for event monitor thoug hthis is less likely Otherwise exam is unremarkable (except for mild nystagms)  I would recomm the pt increase fluids and salt Check cortisol I also would recom ec ASA 81 mg  Activities as tolerated     2  Bradycardia  Pt reported that his HR is usually not this slow   I have not seen any signif pauses   With walking today he increased HR appropriately for walking         F/U based on test results   Current medicines are reviewed at length with the patient today.  The patient does not have concerns regarding medicines.  Signed, 07/16/19, MD  09/07/2019 10:25 PM    [facility] [address] [place], [place], [place]  [place] Phone: [phone]; Fax: [phone]

## 2019-09-07 ENCOUNTER — Encounter: Payer: Self-pay | Admitting: Internal Medicine

## 2019-09-07 ENCOUNTER — Ambulatory Visit: Payer: BLUE CROSS/BLUE SHIELD | Admitting: Internal Medicine

## 2019-09-07 ENCOUNTER — Other Ambulatory Visit: Payer: Self-pay

## 2019-09-07 VITALS — BP 96/70 | HR 59 | Ht 76.0 in | Wt 209.8 lb

## 2019-09-07 DIAGNOSIS — R42 Dizziness and giddiness: Secondary | ICD-10-CM

## 2019-09-07 NOTE — Patient Instructions (Addendum)
Medication Instructions:  1.) aspirin 81 mg daily  *If you need a refill on your cardiac medications before your next appointment, please call your pharmacy*   Lab Work: Today: bmet, cortisol  If you have labs (blood work) drawn today and your tests are completely normal, you will receive your results only by: Marland Kitchen MyChart Message (if you have MyChart) OR . A paper copy in the mail If you have any lab test that is abnormal or we need to change your treatment, we will call you to review the results.   Testing/Procedures: MRI/MRA - head w/wo contrast   Follow-Up: Follow up with your physician will depend on test results.  Other Instructions

## 2019-09-08 LAB — CORTISOL: Cortisol: 10.1 ug/dL

## 2019-09-08 LAB — BASIC METABOLIC PANEL
BUN/Creatinine Ratio: 14 (ref 9–20)
BUN: 17 mg/dL (ref 6–20)
CO2: 26 mmol/L (ref 20–29)
Calcium: 10.3 mg/dL — ABNORMAL HIGH (ref 8.7–10.2)
Chloride: 102 mmol/L (ref 96–106)
Creatinine, Ser: 1.18 mg/dL (ref 0.76–1.27)
GFR calc Af Amer: 94 mL/min/{1.73_m2} (ref >59)
GFR calc non Af Amer: 82 mL/min/{1.73_m2} (ref >59)
Glucose: 87 mg/dL (ref 65–99)
Potassium: 4.9 mmol/L (ref 3.5–5.2)
Sodium: 141 mmol/L (ref 134–144)

## 2019-09-13 ENCOUNTER — Other Ambulatory Visit: Payer: Self-pay | Admitting: Internal Medicine

## 2019-09-13 ENCOUNTER — Telehealth: Payer: Self-pay | Admitting: Internal Medicine

## 2019-09-13 DIAGNOSIS — R42 Dizziness and giddiness: Secondary | ICD-10-CM

## 2019-09-13 NOTE — Telephone Encounter (Signed)
The patient wanted to reports some updates to Dr. Tenny Craw since he spoke with her yesterday.   His MRI has been r/s to 9/18 because there was no authorization through insurance yet. His friend and PCP Sheria Lang Philips) spoke w him last night and looked some of his records and adv that   1) would Dr. Tenny Craw consider a D-Dimer in the meantime  2) The head CT shows a maxillary mucus retention cyst and this could be causing symptoms  I have sent a message to pre cert for help with authorizing MRI, and will forward this information to Dr. Tenny Craw.

## 2019-09-13 NOTE — Telephone Encounter (Signed)
Pt called to speak to Dr. Tenny Craw. Stated that he received phone call and stated that she was worried about the MRI and scheduled one further out. Pt had some questions to ask about it as well. Please call to discuss

## 2019-09-15 ENCOUNTER — Ambulatory Visit (HOSPITAL_COMMUNITY): Admission: RE | Admit: 2019-09-15 | Payer: BLUE CROSS/BLUE SHIELD | Source: Ambulatory Visit

## 2019-09-18 NOTE — Telephone Encounter (Signed)
I called pt on Fri.  Still with dizziness   It is lightheadedness  Symptoms are different from vertigo. MRI scheduled

## 2019-09-20 ENCOUNTER — Telehealth: Payer: Self-pay | Admitting: Internal Medicine

## 2019-09-20 NOTE — Telephone Encounter (Signed)
New Message   Patient is calling because he has an order for an MRI. Unfortunately [facility] is out of network. He would like to see if the order can be sent to [facility]. Please call to discuss.

## 2019-09-22 ENCOUNTER — Ambulatory Visit (HOSPITAL_COMMUNITY): Admission: RE | Admit: 2019-09-22 | Payer: BLUE CROSS/BLUE SHIELD | Source: Ambulatory Visit

## 2019-09-22 ENCOUNTER — Other Ambulatory Visit (HOSPITAL_COMMUNITY): Payer: BLUE CROSS/BLUE SHIELD

## 2019-09-24 NOTE — Telephone Encounter (Signed)
Paper referral/order for MRI completed, signed by Dr. Tenny Craw and given back to [facility] for processing/scheduling.

## 2021-02-04 IMAGING — CT CT HEAD W/O CM
4 series · 16 of 47 positions shown, 18 images · non-contrast
Comparison: Maxillofacial CT 04/11/2010, head CT 11/03/2005. Report
from head CT 08/06/2007 (images unavailable).

CLINICAL DATA: Dizziness, nonspecific. Additional history provided:
Patient reports lightheadedness and dizziness for over 1 week,
intermittent chest pain and shortness of breath.

EXAM:
CT HEAD WITHOUT CONTRAST
TECHNIQUE: Contiguous axial images were obtained from the base of the skull
through the vertex without intravenous contrast.

[Series 3: head wo · axial · 0.41mm/px · z∈[-150,-30]mm · 7 of 34 slices shown, 9 images]
[im 5/34  brain]
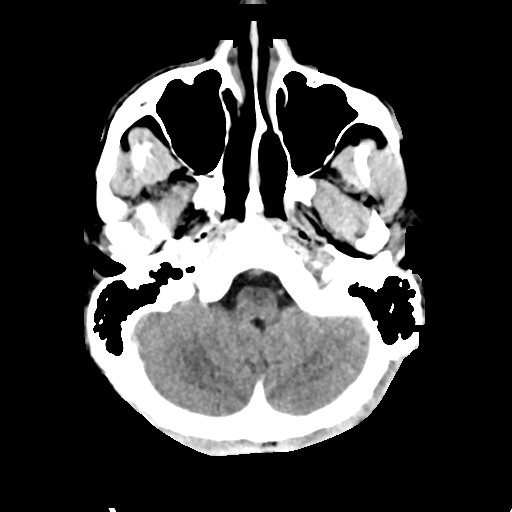
[im 5/34  bone]
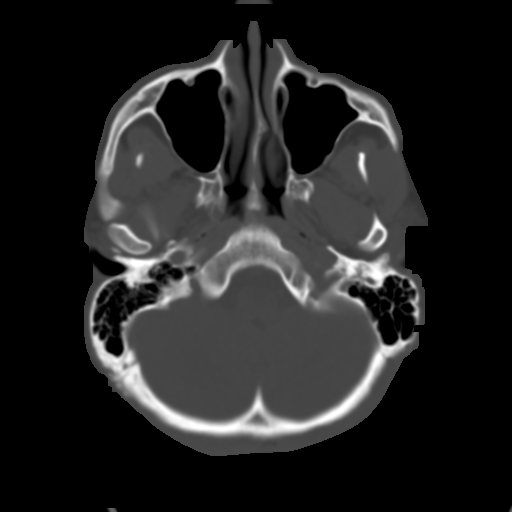
[im 9/34  brain]
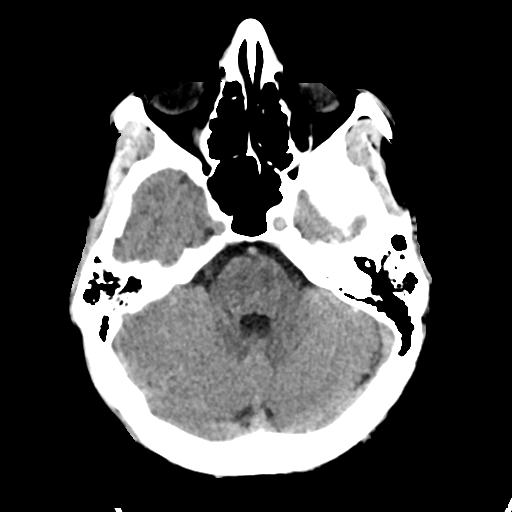
[im 13/34  brain]
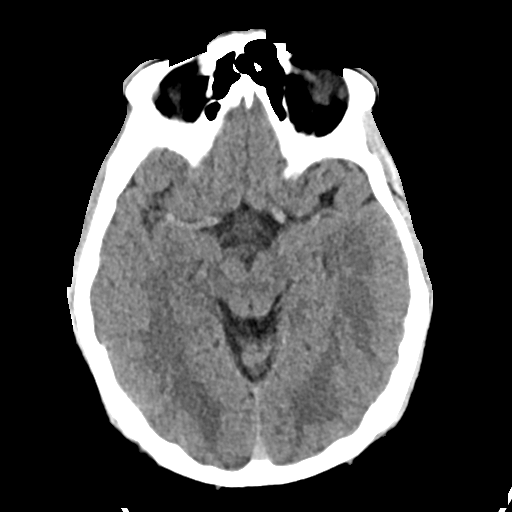
[im 17/34  brain]
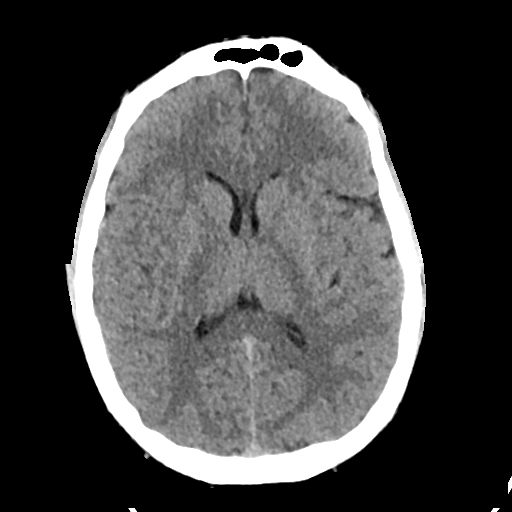
[im 21/34  brain]
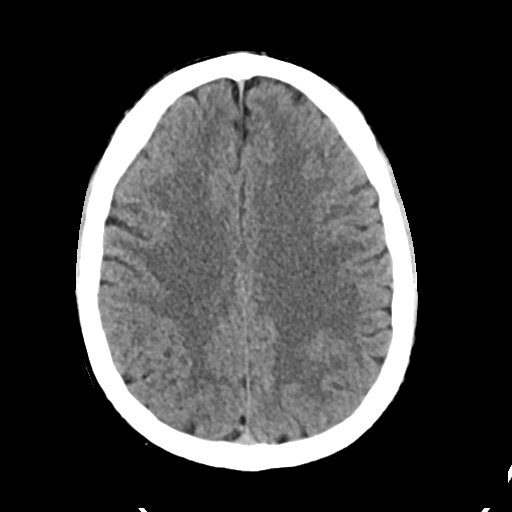
[im 21/34  bone]
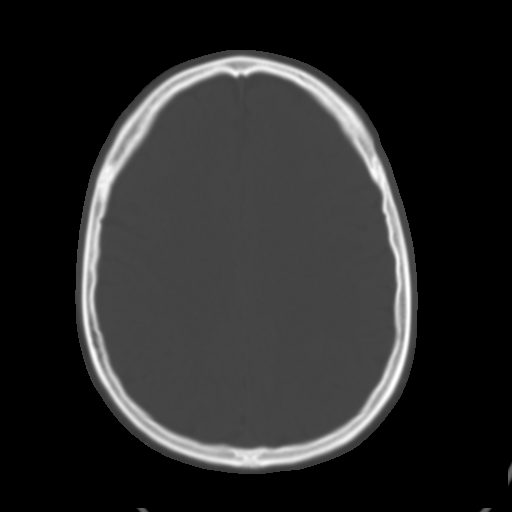
[im 25/34  brain]
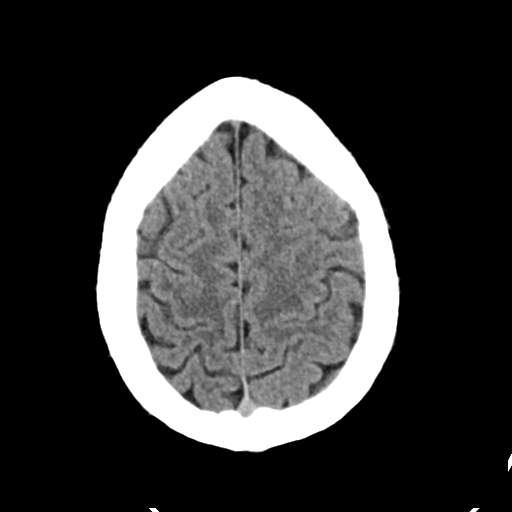
[im 29/34  brain]
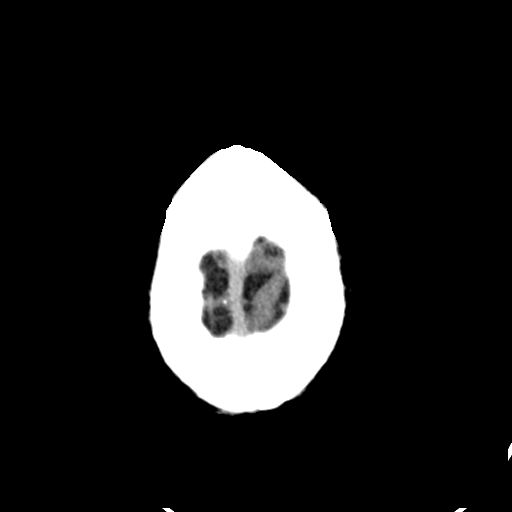

[Series 4: head bone · axial · 0.41mm/px · z∈[-154,-120]mm · 3 of 85 slices shown]
[im 9/85  bone]
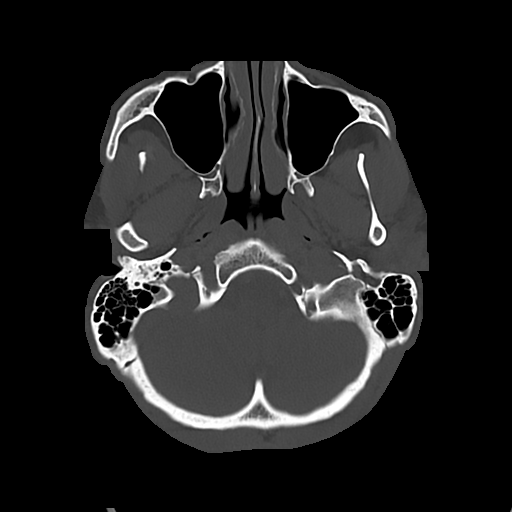
[im 17/85  bone]
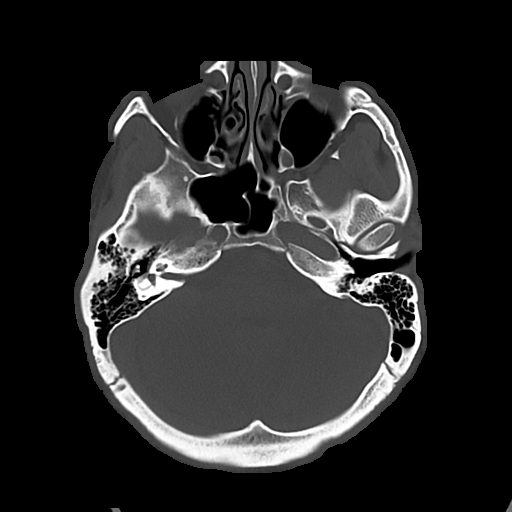
[im 26/85  bone]
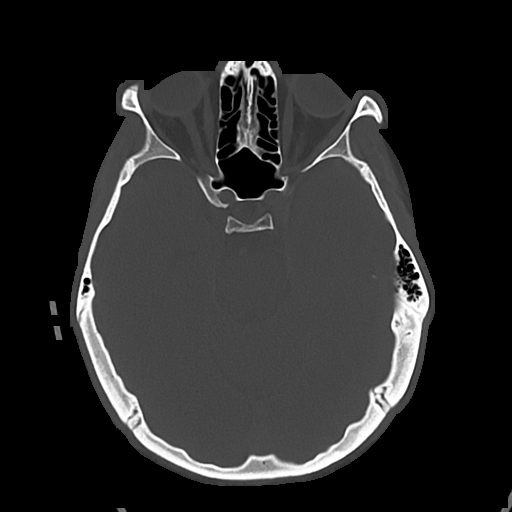

[Series 5: cor soft · coronal · 0.32mm/px · 3 of 72 slices shown]
[im 24/72  brain]
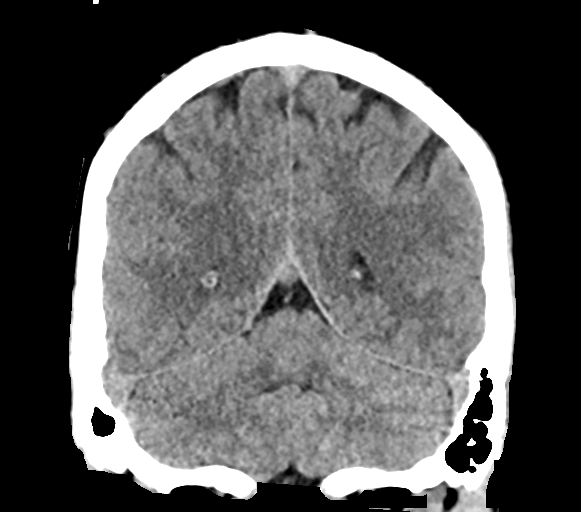
[im 32/72  brain]
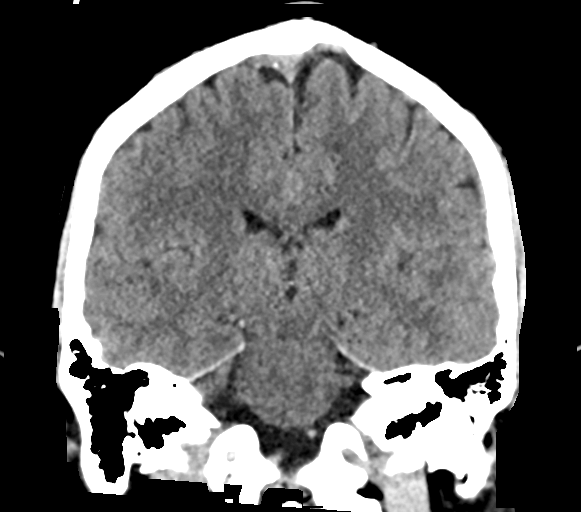
[im 40/72  brain]
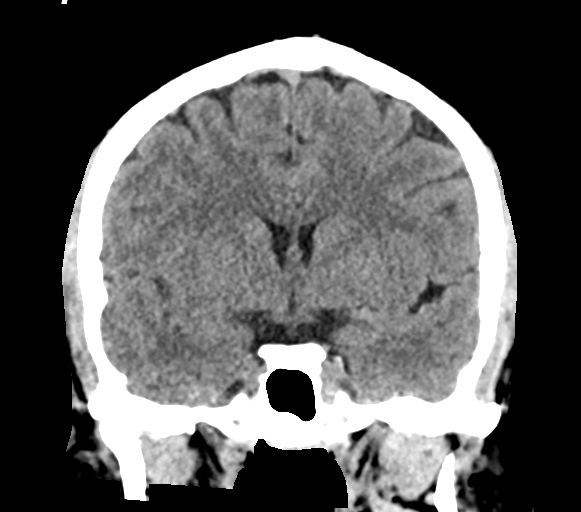

[Series 6: sag soft · sagittal · 0.32mm/px · 3 of 62 slices shown]
[im 21/62  brain]
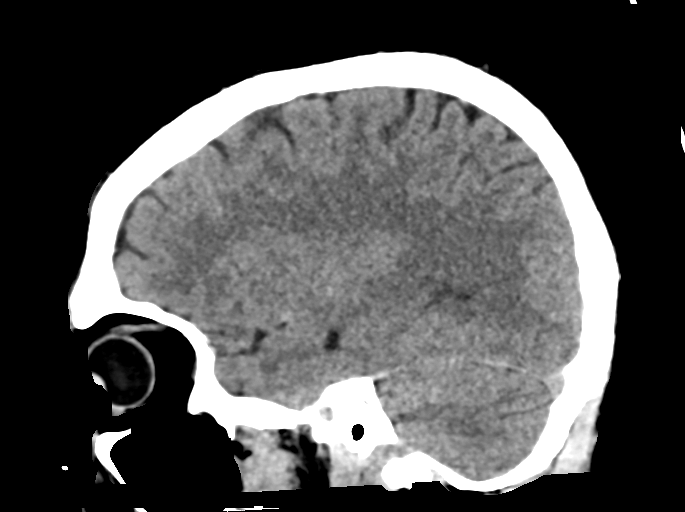
[im 31/62  brain]
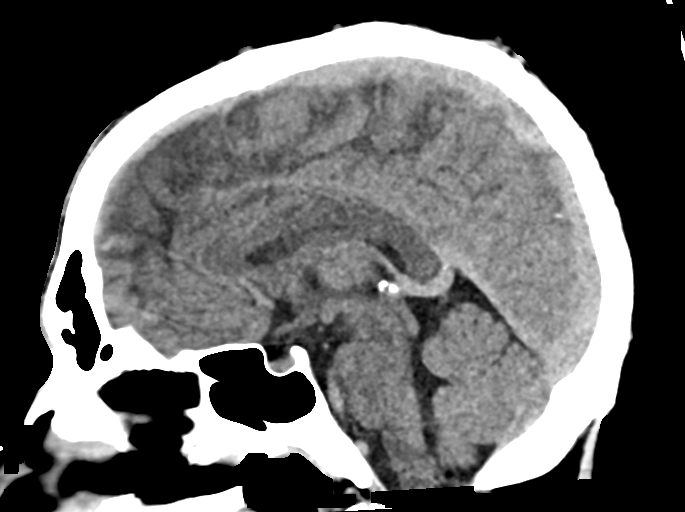
[im 41/62  brain]
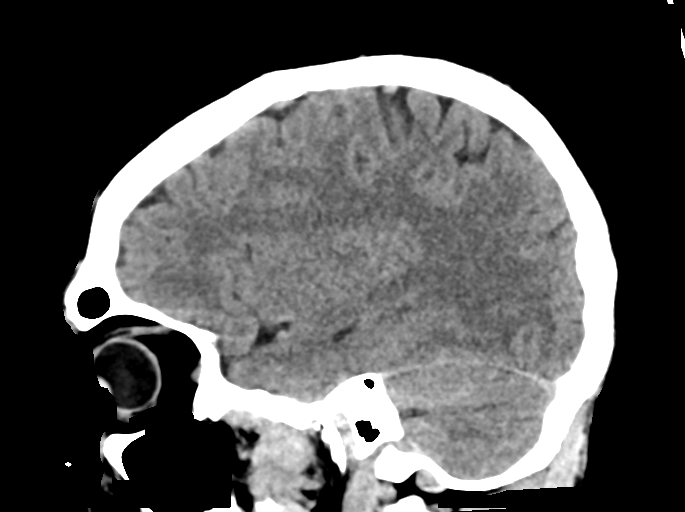

[16 of 47 positions shown; findings below may reference images not displayed]

FINDINGS: Brain:

Cerebral volume is normal.

There is no acute intracranial hemorrhage.

No demarcated cortical infarct.

No extra-axial fluid collection.

No evidence of intracranial mass.

No midline shift.

Vascular: No hyperdense vessel.

Skull: Normal. Negative for fracture or focal lesion.

Sinuses/Orbits: Visualized orbits show no acute finding. Small left
maxillary sinus mucous retention cyst. No significant mastoid
effusion.
IMPRESSION: Unremarkable non-contrast CT appearance of the brain. No evidence of
acute intracranial abnormality.

Small left maxillary sinus mucous retention cyst.
